# Patient Record
Sex: Male | Born: 1980
Health system: Southern US, Community
[De-identification: ages and names within clinical notes are randomized; demographics above are authoritative.]

---

## 2009-09-10 ENCOUNTER — Emergency Department (HOSPITAL_BASED_OUTPATIENT_CLINIC_OR_DEPARTMENT_OTHER): Admission: EM | Admit: 2009-09-10 | Discharge: 2009-09-10 | Payer: Self-pay | Admitting: Emergency Medicine

## 2009-09-20 ENCOUNTER — Emergency Department (HOSPITAL_BASED_OUTPATIENT_CLINIC_OR_DEPARTMENT_OTHER): Admission: EM | Admit: 2009-09-20 | Discharge: 2009-09-20 | Payer: Self-pay | Admitting: Emergency Medicine

## 2010-08-30 ENCOUNTER — Emergency Department (HOSPITAL_BASED_OUTPATIENT_CLINIC_OR_DEPARTMENT_OTHER)
Admission: EM | Admit: 2010-08-30 | Discharge: 2010-08-30 | Disposition: A | Discharge status: Home / Self Care | Payer: Worker's Compensation | Attending: Emergency Medicine | Admitting: Emergency Medicine

## 2010-08-30 ENCOUNTER — Encounter: Payer: Self-pay | Admitting: Family Medicine

## 2010-08-30 DIAGNOSIS — X503XXA Overexertion from repetitive movements, initial encounter: Secondary | ICD-10-CM | POA: Insufficient documentation

## 2010-08-30 DIAGNOSIS — S335XXA Sprain of ligaments of lumbar spine, initial encounter: Secondary | ICD-10-CM

## 2010-08-30 MED ORDER — IBUPROFEN 800 MG PO TABS
800.0000 mg | ORAL_TABLET | Freq: Once | ORAL | Status: AC
  Administered 2010-08-30: 800 mg via ORAL
  Filled 2010-08-30: qty 1

## 2010-08-30 MED ORDER — IBUPROFEN 800 MG PO TABS: 800.0000 mg | ORAL_TABLET | Freq: Three times a day (TID) | ORAL | Status: AC

## 2010-08-30 NOTE — ED Provider Notes (Signed)
History     CSN: [id number] Arrival date & time: 08/30/2010  8:42 AM  Chief Complaint  Patient presents with  . Back Pain   Patient is a 30 y.o. male presenting with back pain. The history is provided by the patient.  Back Pain  The current episode started 1 to 2 hours ago. The problem occurs constantly. The problem has not changed since onset.The pain is associated with lifting heavy objects and twisting. The pain is present in the lumbar spine. The quality of the pain is described as stabbing. The pain does not radiate. The pain is at a severity of 5/10. The symptoms are aggravated by bending, twisting and certain positions. Pertinent negatives include no fever, no numbness, no bowel incontinence, no perianal numbness, no leg pain, no paresthesias, no tingling and no weakness. He has tried nothing for the symptoms. Risk factors include obesity.    History reviewed. No pertinent past medical history.  History reviewed. No pertinent past surgical history.  No family history on file.  History  Substance Use Topics  . Smoking status: Former Smoker    Quit date: 06/30/2010  . Smokeless tobacco: Not on file  . Alcohol Use: Yes      Review of Systems  Constitutional: Negative for fever.  Gastrointestinal: Negative for bowel incontinence.  Musculoskeletal: Positive for back pain.  Neurological: Negative for tingling, weakness, numbness and paresthesias.  All other systems reviewed and are negative.    Physical Exam  BP 133/85  Pulse 63  Temp(Src) 97.6 F (36.4 C) (Oral)  Resp 12  Ht 6' (1.829 m)  Wt 230 lb (104.327 kg)  BMI 31.19 kg/m2  SpO2 100%  Physical Exam  Nursing note and vitals reviewed. Constitutional: He is oriented to person, place, and time. He appears well-developed.  HENT:  Head: Normocephalic.  Eyes: Pupils are equal, round, and reactive to light.  Neck: Normal range of motion.  Musculoskeletal:       Lumbar back: He exhibits decreased range of motion,  tenderness and swelling. He exhibits no pain and no spasm.       Arms: Neurological: He is alert and oriented to person, place, and time. He has normal strength and normal reflexes. No sensory deficit. He displays a negative Romberg sign. GCS eye subscore is 4. GCS verbal subscore is 5. GCS motor subscore is 6.    ED Course  Procedures  MDM       Hilario Quarry, MD 08/30/10 [phone]

## 2010-08-30 NOTE — ED Notes (Signed)
Pt sts he was lifting and twisting while working this morning and sts he "felt something pop" in right low back. Pt reports injuring back in past.

## 2012-07-04 ENCOUNTER — Other Ambulatory Visit: Payer: Self-pay | Admitting: Oncology

## 2015-03-28 ENCOUNTER — Emergency Department (HOSPITAL_BASED_OUTPATIENT_CLINIC_OR_DEPARTMENT_OTHER): Payer: Worker's Compensation

## 2015-03-28 ENCOUNTER — Emergency Department (HOSPITAL_BASED_OUTPATIENT_CLINIC_OR_DEPARTMENT_OTHER)
Admission: EM | Admit: 2015-03-28 | Discharge: 2015-03-28 | Disposition: A | Discharge status: Home / Self Care | Payer: Worker's Compensation | Attending: Emergency Medicine | Admitting: Emergency Medicine

## 2015-03-28 ENCOUNTER — Encounter (HOSPITAL_BASED_OUTPATIENT_CLINIC_OR_DEPARTMENT_OTHER): Payer: Self-pay | Admitting: *Deleted

## 2015-03-28 DIAGNOSIS — Y998 Other external cause status: Secondary | ICD-10-CM | POA: Insufficient documentation

## 2015-03-28 DIAGNOSIS — S29001A Unspecified injury of muscle and tendon of front wall of thorax, initial encounter: Secondary | ICD-10-CM | POA: Diagnosis not present

## 2015-03-28 DIAGNOSIS — Z87891 Personal history of nicotine dependence: Secondary | ICD-10-CM | POA: Insufficient documentation

## 2015-03-28 DIAGNOSIS — W01198A Fall on same level from slipping, tripping and stumbling with subsequent striking against other object, initial encounter: Secondary | ICD-10-CM | POA: Insufficient documentation

## 2015-03-28 DIAGNOSIS — Y9289 Other specified places as the place of occurrence of the external cause: Secondary | ICD-10-CM | POA: Insufficient documentation

## 2015-03-28 DIAGNOSIS — Y9389 Activity, other specified: Secondary | ICD-10-CM | POA: Insufficient documentation

## 2015-03-28 DIAGNOSIS — R0789 Other chest pain: Secondary | ICD-10-CM

## 2015-03-28 DIAGNOSIS — S299XXA Unspecified injury of thorax, initial encounter: Secondary | ICD-10-CM | POA: Diagnosis present

## 2015-03-28 MED ORDER — METHOCARBAMOL 500 MG PO TABS: 500.0000 mg | ORAL_TABLET | Freq: Three times a day (TID) | ORAL | Status: AC | PRN

## 2015-03-28 MED ORDER — IBUPROFEN 600 MG PO TABS: 600.0000 mg | ORAL_TABLET | Freq: Three times a day (TID) | ORAL | Status: AC | PRN

## 2015-03-28 MED ORDER — HYDROCODONE-ACETAMINOPHEN 5-325 MG PO TABS
1.0000 | ORAL_TABLET | Freq: Once | ORAL | Status: AC
  Administered 2015-03-28: 1 via ORAL
  Filled 2015-03-28: qty 1

## 2015-03-28 MED FILL — IBUPROFEN 600 MG TABLET: 600 | 5 days supply | Qty: 15 | Fill #0

## 2015-03-28 MED FILL — METHOCARBAMOL 500 MG TABLET: 500 | 8 days supply | Qty: 20 | Fill #0

## 2015-03-28 NOTE — ED Notes (Signed)
No discoloration noted on left side of chest, but very tender to palpation at base of ribs

## 2015-03-28 NOTE — Discharge Instructions (Signed)
Rib Contusion A rib contusion is a deep bruise on your rib area. Contusions are the result of a blunt trauma that causes bleeding and injury to the tissues under the skin. A rib contusion may involve bruising of the ribs and of the skin and muscles in the area. The skin overlying the contusion may turn blue, purple, or yellow. Minor injuries will give you a painless contusion, but more severe contusions may stay painful and swollen for a few weeks. CAUSES  A contusion is usually caused by a blow, trauma, or direct force to an area of the body. This often occurs while playing contact sports. SYMPTOMS  Swelling and redness of the injured area.  Discoloration of the injured area.  Tenderness and soreness of the injured area.  Pain with or without movement. DIAGNOSIS  The diagnosis can be made by taking a medical history and performing a physical exam. An X-ray, CT scan, or MRI may be needed to determine if there were any associated injuries, such as broken bones (fractures) or internal injuries. TREATMENT  Often, the best treatment for a rib contusion is rest. Icing or applying cold compresses to the injured area may help reduce swelling and inflammation. Deep breathing exercises may be recommended to reduce the risk of partial lung collapse and pneumonia. Over-the-counter or prescription medicines may also be recommended for pain control. HOME CARE INSTRUCTIONS   Apply ice to the injured area:  Put ice in a plastic bag.  Place a towel between your skin and the bag.  Leave the ice on for 20 minutes, 2-3 times per day.  Take medicines only as directed by your health care provider.  Rest the injured area. Avoid strenuous activity and any activities or movements that cause pain. Be careful during activities and avoid bumping the injured area.  Perform deep-breathing exercises as directed by your health care provider.  Do not lift anything that is heavier than 5 lb (2.3 kg) until your  health care provider approves.  Do not use any tobacco products, including cigarettes, chewing tobacco, or electronic cigarettes. If you need help quitting, ask your health care provider. SEEK MEDICAL CARE IF:   You have increased bruising or swelling.  You have pain that is not controlled with treatment.  You have a fever. SEEK IMMEDIATE MEDICAL CARE IF:   You have difficulty breathing or shortness of breath.  You develop a continual cough, or you cough up thick or bloody sputum.  You feel sick to your stomach (nauseous), you throw up (vomit), or you have abdominal pain.   This information is not intended to replace advice given to you by your health care provider. Make sure you discuss any questions you have with your health care provider.   Document Released: 10/08/2000 Document Revised: 02/03/2014 Document Reviewed: 10/25/2013 Elsevier Interactive Patient Education 2016 [employer]  

## 2015-03-28 NOTE — ED Notes (Signed)
Pt states he took 4 Ibuprofen tabs prior to arrival to ED

## 2015-03-28 NOTE — ED Provider Notes (Signed)
CSN: [id number]     Arrival date & time 03/28/15  1033 History   First MD Initiated Contact with Patient 03/28/15 1044     Chief Complaint  Patient presents with  . Chest Pain      HPI Patient states he is getting in the car when he slipped and fell and injured his left chest.  He states that he struck his left chest wall on the bottom of the door frame.  He denies significant shortness of breath.  He denies bruising.  No recent illness.  He states that he slipped because of water under his boots.  His pain is moderate in severity and worse with movement and palpation of his left anterolateral chest   History reviewed. No pertinent past medical history. History reviewed. No pertinent past surgical history. No family history on file. Social History  Substance Use Topics  . Smoking status: Former Smoker    Quit date: 06/30/2010  . Smokeless tobacco: None  . Alcohol Use: Yes    Review of Systems  All other systems reviewed and are negative.     Allergies  Review of patient's allergies indicates no known allergies.  Home Medications   Prior to Admission medications   Medication Sig Start Date End Date Taking? Authorizing Provider  ibuprofen (ADVIL,MOTRIN) 600 MG tablet Take 1 tablet (600 mg total) by mouth every 8 (eight) hours as needed. 03/28/15   Azalia Bilis, MD  methocarbamol (ROBAXIN) 500 MG tablet Take 1 tablet (500 mg total) by mouth every 8 (eight) hours as needed (pain). 03/28/15   Azalia Bilis, MD   BP 151/88 mmHg  Pulse 80  Temp(Src) 98.2 F (36.8 C) (Oral)  Resp 20  Ht  (1.803 m)  Wt 230 lb (104.327 kg)  BMI 32.09 kg/m2  SpO2 100% Physical Exam  Constitutional: He is oriented to person, place, and time. He appears well-developed and well-nourished.  HENT:  Head: Normocephalic.  Eyes: EOM are normal.  Neck: Normal range of motion.  Cardiovascular: Normal rate.   Pulmonary/Chest: Effort normal and breath sounds normal.  Tenderness left anterolateral  chest wall without obvious deformity.  No bruising or skin changes noted  Abdominal: He exhibits no distension.  Musculoskeletal: Normal range of motion.  Neurological: He is alert and oriented to person, place, and time.  Psychiatric: He has a normal mood and affect.  Nursing note and vitals reviewed.   ED Course  Procedures (including critical care time) Labs Review Labs Reviewed - No data to display  Imaging Review Dg Chest 2 View  03/28/2015  CLINICAL DATA:  Pain following fall from truck earlier today EXAM: CHEST  2 VIEW COMPARISON:  None. FINDINGS: Lungs are clear. Heart size and pulmonary vascularity are normal. No adenopathy. No pneumothorax. No bone lesions. IMPRESSION: No edema or consolidation.  No pneumothorax. Electronically Signed   By: Bretta Bang III M.D.   On: 03/28/2015 11:01   I have personally reviewed and evaluated these images and lab results as part of my medical decision-making.   EKG Interpretation None      MDM   Final diagnoses:  Chest wall pain    Likely rib contusion without obvious fracture.  Discharge home in good condition.  Home with anti-inflammatories and muscle relaxants.    Azalia Bilis, MD 03/28/15 1110

## 2015-03-28 NOTE — ED Notes (Signed)
Pt states fell onto truck, hit left side of chest, c/o pain at base of left ribs

## 2015-03-28 NOTE — ED Notes (Signed)
DC instructions reviewed with pt, also discussed the two Rx as written by ED, pt teaching regards to cough and deep breathing. Opportunity for questions provided

## 2015-03-28 NOTE — ED Notes (Signed)
Patient transported to X-ray 

## 2017-06-07 IMAGING — CR DG CHEST 2V
2 series · 2 of 2 positions shown · non-contrast
Comparison: None.

CLINICAL DATA: Pain following fall from truck earlier today

EXAM:
CHEST  2 VIEW

[w chest pa]
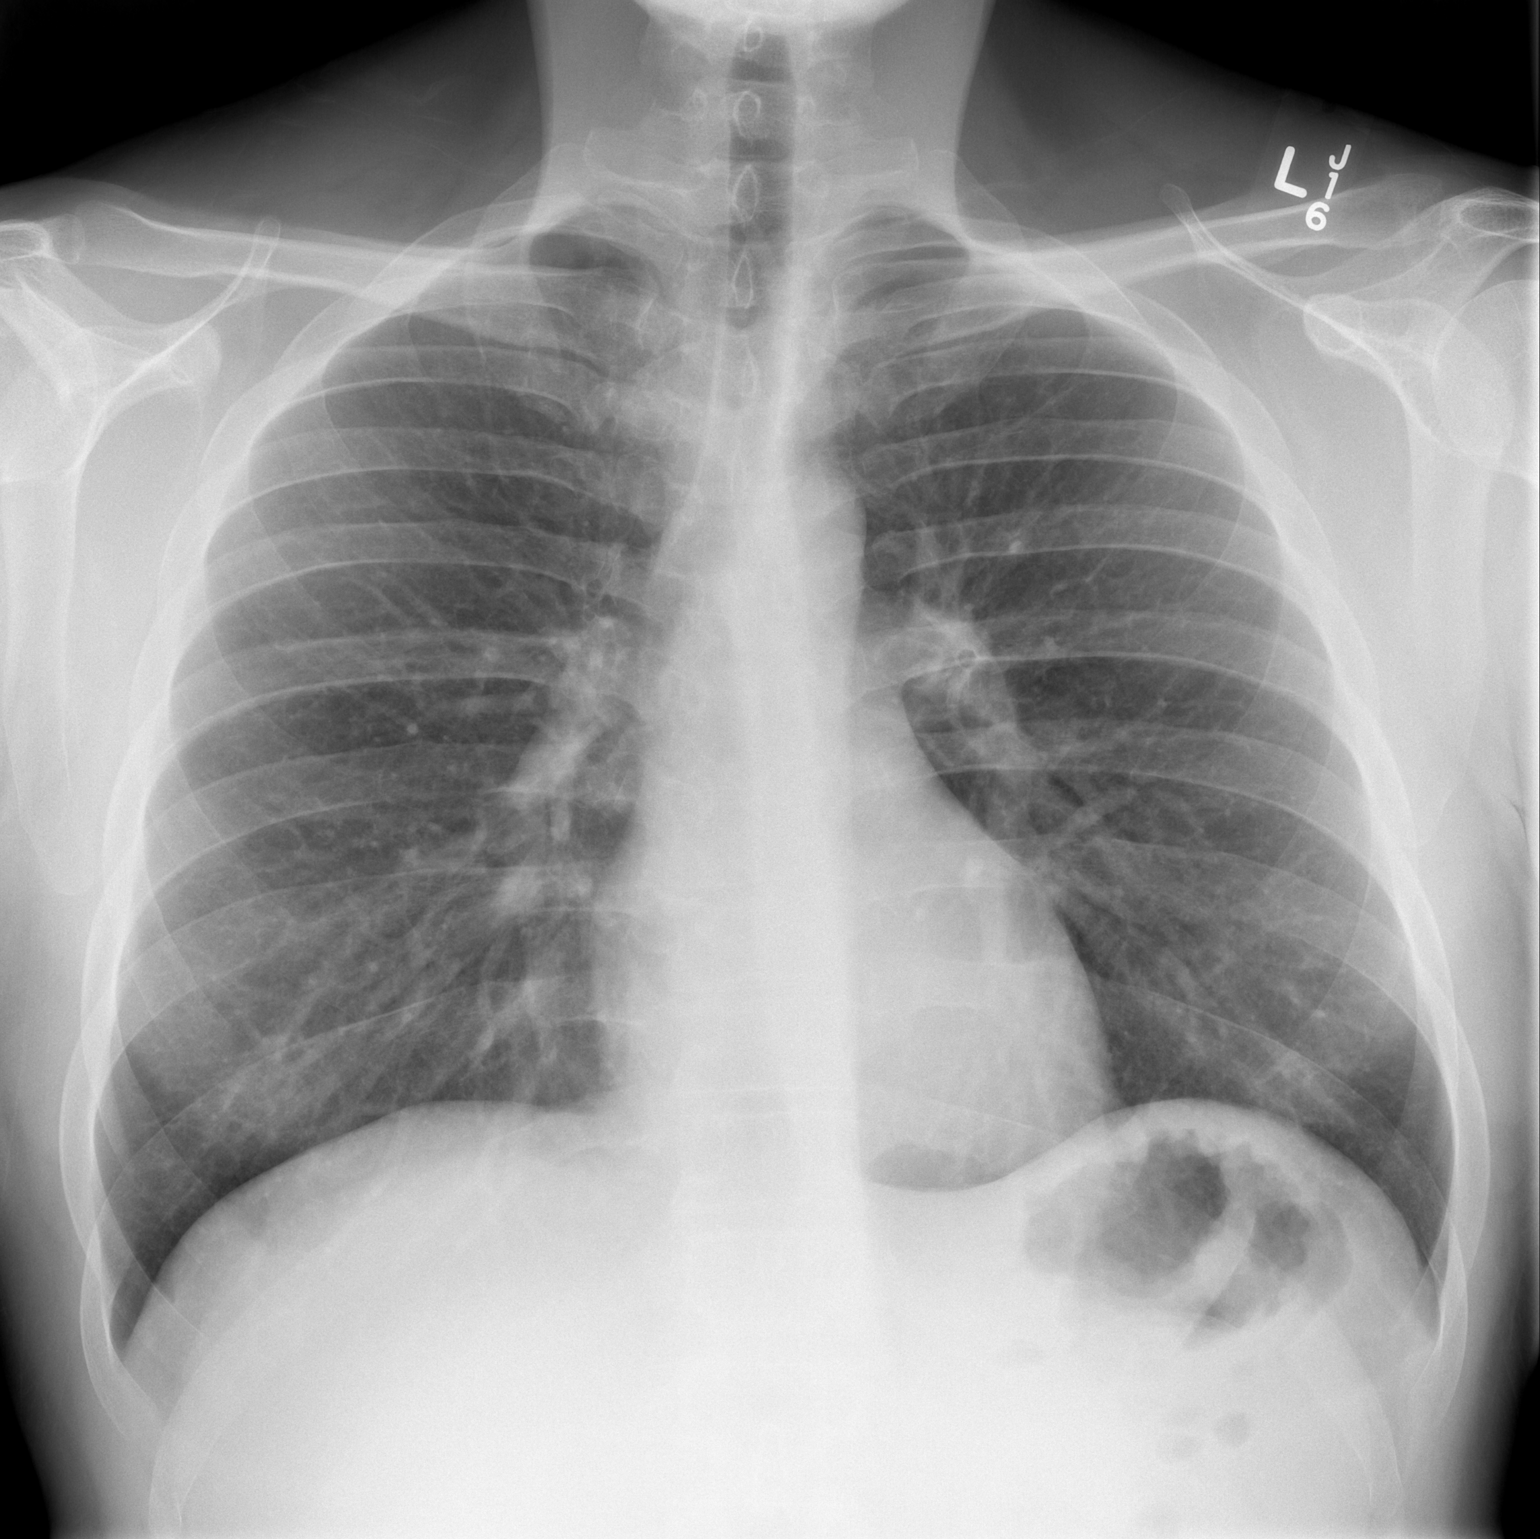

[w chest lat]
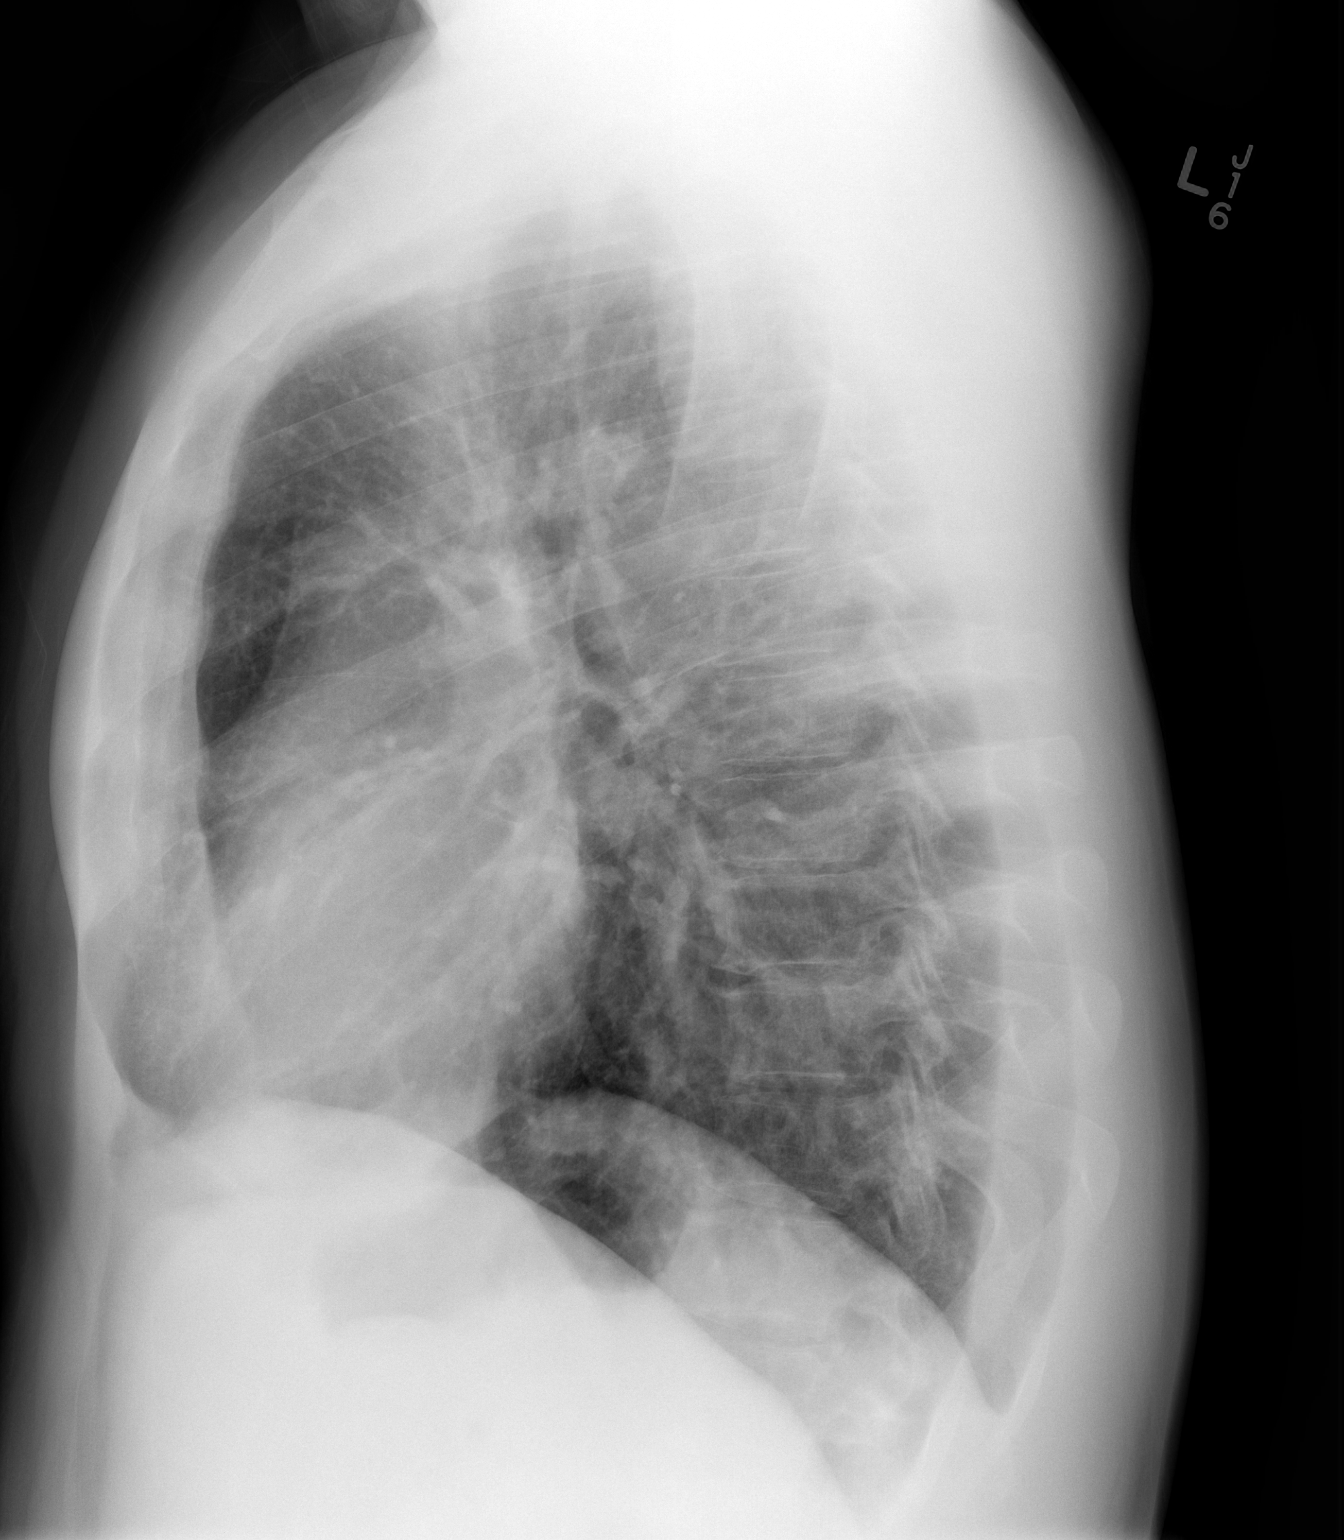

[2 of 2 positions shown; findings below may reference images not displayed]

FINDINGS: Lungs are clear. Heart size and pulmonary vascularity are normal. No
adenopathy. No pneumothorax. No bone lesions.
IMPRESSION: No edema or consolidation.  No pneumothorax.
